# Patient Record
Sex: Male | Born: 1990 | Hispanic: No | Marital: Single | Smoking: Never smoker
Health system: Southern US, Community
[De-identification: ages and names within clinical notes are randomized; demographics above are authoritative.]

---

## 2018-09-05 ENCOUNTER — Other Ambulatory Visit: Payer: Self-pay

## 2018-09-05 ENCOUNTER — Emergency Department (HOSPITAL_COMMUNITY)
Admission: EM | Admit: 2018-09-05 | Discharge: 2018-09-05 | Disposition: A | Discharge status: Home / Self Care | Payer: Self-pay | Attending: Emergency Medicine | Admitting: Emergency Medicine

## 2018-09-05 ENCOUNTER — Emergency Department (HOSPITAL_COMMUNITY): Payer: Self-pay

## 2018-09-05 ENCOUNTER — Encounter (HOSPITAL_COMMUNITY): Payer: Self-pay | Admitting: Emergency Medicine

## 2018-09-05 DIAGNOSIS — R112 Nausea with vomiting, unspecified: Secondary | ICD-10-CM

## 2018-09-05 DIAGNOSIS — R519 Headache, unspecified: Secondary | ICD-10-CM

## 2018-09-05 DIAGNOSIS — R5383 Other fatigue: Secondary | ICD-10-CM

## 2018-09-05 DIAGNOSIS — R51 Headache: Secondary | ICD-10-CM | POA: Insufficient documentation

## 2018-09-05 LAB — URINALYSIS, ROUTINE W REFLEX MICROSCOPIC
BILIRUBIN URINE: NEGATIVE
GLUCOSE, UA: NEGATIVE mg/dL
HGB URINE DIPSTICK: NEGATIVE
Ketones, ur: NEGATIVE mg/dL
Leukocytes, UA: NEGATIVE
Nitrite: NEGATIVE
Protein, ur: NEGATIVE mg/dL
Specific Gravity, Urine: 1.021 (ref 1.005–1.030)
pH: 7 (ref 5.0–8.0)

## 2018-09-05 LAB — CBC WITH DIFFERENTIAL/PLATELET
Abs Immature Granulocytes: 0.02 10*3/uL (ref 0.00–0.07)
BASOS PCT: 1 %
Basophils Absolute: 0.1 10*3/uL (ref 0.0–0.1)
EOS PCT: 4 %
Eosinophils Absolute: 0.2 10*3/uL (ref 0.0–0.5)
HCT: 48.7 % (ref 39.0–52.0)
HEMOGLOBIN: 15.5 g/dL (ref 13.0–17.0)
Immature Granulocytes: 0 %
LYMPHS PCT: 28 %
Lymphs Abs: 1.6 10*3/uL (ref 0.7–4.0)
MCH: 29.7 pg (ref 26.0–34.0)
MCHC: 31.8 g/dL (ref 30.0–36.0)
MCV: 93.3 fL (ref 80.0–100.0)
MONO ABS: 0.6 10*3/uL (ref 0.1–1.0)
Monocytes Relative: 10 %
Neutro Abs: 3.3 10*3/uL (ref 1.7–7.7)
Neutrophils Relative %: 57 %
Platelets: 218 10*3/uL (ref 150–400)
RBC: 5.22 MIL/uL (ref 4.22–5.81)
RDW: 12.4 % (ref 11.5–15.5)
WBC: 5.8 10*3/uL (ref 4.0–10.5)
nRBC: 0 % (ref 0.0–0.2)

## 2018-09-05 LAB — COMPREHENSIVE METABOLIC PANEL
ALK PHOS: 62 U/L (ref 38–126)
ALT: 19 U/L (ref 0–44)
AST: 25 U/L (ref 15–41)
Albumin: 4 g/dL (ref 3.5–5.0)
Anion gap: 7 (ref 5–15)
BILIRUBIN TOTAL: 0.7 mg/dL (ref 0.3–1.2)
BUN: 15 mg/dL (ref 6–20)
CALCIUM: 9.2 mg/dL (ref 8.9–10.3)
CO2: 26 mmol/L (ref 22–32)
CREATININE: 1.44 mg/dL — AB (ref 0.61–1.24)
Chloride: 104 mmol/L (ref 98–111)
Glucose, Bld: 116 mg/dL — ABNORMAL HIGH (ref 70–99)
Potassium: 3.8 mmol/L (ref 3.5–5.1)
Sodium: 137 mmol/L (ref 135–145)
Total Protein: 7.7 g/dL (ref 6.5–8.1)

## 2018-09-05 LAB — LIPASE, BLOOD: LIPASE: 34 U/L (ref 11–51)

## 2018-09-05 MED ORDER — PROCHLORPERAZINE EDISYLATE 10 MG/2ML IJ SOLN
10.0000 mg | Freq: Once | INTRAMUSCULAR | Status: AC
  Administered 2018-09-05: 10 mg via INTRAVENOUS
  Filled 2018-09-05: qty 2

## 2018-09-05 MED ORDER — SODIUM CHLORIDE 0.9 % IV BOLUS
1000.0000 mL | Freq: Once | INTRAVENOUS | Status: AC
  Administered 2018-09-05: 1000 mL via INTRAVENOUS

## 2018-09-05 MED ORDER — KETOROLAC TROMETHAMINE 30 MG/ML IJ SOLN
30.0000 mg | Freq: Once | INTRAMUSCULAR | Status: AC
  Administered 2018-09-05: 30 mg via INTRAVENOUS
  Filled 2018-09-05: qty 1

## 2018-09-05 MED ORDER — DIPHENHYDRAMINE HCL 50 MG/ML IJ SOLN
25.0000 mg | Freq: Once | INTRAMUSCULAR | Status: AC
  Administered 2018-09-05: 25 mg via INTRAVENOUS
  Filled 2018-09-05: qty 1

## 2018-09-05 NOTE — ED Notes (Signed)
Patient transported to X-ray 

## 2018-09-05 NOTE — ED Triage Notes (Signed)
Pt. Stated, I think my body core temperature dropped yesterday and I was outside. I also had headache with N/V. Ive had a cough off and on. I did get some weakness

## 2018-09-05 NOTE — ED Provider Notes (Signed)
[facility] EMERGENCY DEPARTMENT Provider Note   CSN: [id number] Arrival date & time: 09/05/18  4098     History   Chief Complaint Chief Complaint  Patient presents with  . Chills  . Headache  . Cough  . Nausea    HPI Kurt Sparks is a 27 y.o. male.  Kurt Sparks is a 27 y.o. Male with a history of alcohol abuse, sober for 2 months, who presents to the emergency department for evaluation of headache, nausea and vomiting.  He reports that yesterday he was outside in the cold and rain for most of the day volunteering and thinks "his core body temperature dropped".  He reports since then he is been feeling kind of generally unwell with some chills.  He has had a headache since last night over the left side of his head that he describes as a dull ache he denies any associated vision changes.  But this morning his been feeling nauseated and had 2 episodes of nonbloody emesis.  He denies any numbness weakness or tingling in his arms or legs, no difficulty with speech or swallowing.  No facial asymmetry.  He denies any abdominal pain.  No chest pain or shortness of breath although does report that he has had a cough on and off for the past 2 weeks that is occasionally productive.  He reports he is felt generally weak and tired this morning after being up all day yesterday.  He denies any nasal congestion, rhinorrhea, sore throat or ear pain.  No body aches or joint pain.  No rashes.  Does not take any medications on a daily basis and has not taken anything for his symptoms prior to arrival.  Reports he used to be a heavy drinker, but has been clean from alcohol for 2 months.     History reviewed. No pertinent past medical history.  There are no active problems to display for this patient.   History reviewed. No pertinent surgical history.      Home Medications    Prior to Admission medications   Not on File    Family History No family history on  file.  Social History Social History   Tobacco Use  . Smoking status: Never Smoker  . Smokeless tobacco: Never Used  Substance Use Topics  . Alcohol use: Not on file  . Drug use: Not on file     Allergies   Patient has no allergy information on record.   Review of Systems Review of Systems  Constitutional: Positive for chills and fatigue. Negative for fever.  HENT: Negative for congestion, ear pain, rhinorrhea and sore throat.   Eyes: Negative for visual disturbance.  Respiratory: Positive for cough. Negative for chest tightness, shortness of breath and wheezing.   Cardiovascular: Negative for chest pain.  Gastrointestinal: Positive for nausea and vomiting. Negative for abdominal pain, constipation and diarrhea.  Genitourinary: Negative for dysuria and frequency.  Musculoskeletal: Negative for arthralgias, back pain, myalgias, neck pain and neck stiffness.  Skin: Negative for color change and rash.  Neurological: Positive for weakness and headaches. Negative for dizziness, syncope, facial asymmetry, speech difficulty, light-headedness and numbness.     Physical Exam Updated Vital Signs BP (!) 145/101 (BP Location: Right Arm)   Pulse (!) 104   Temp 98 F (36.7 C) (Oral)   Resp 18   Ht 5\' 7"  (1.702 m)   Wt 68 kg   SpO2 100%   BMI 23.49 kg/m   Physical Exam  Constitutional: He is oriented to person, place, and time. He appears well-developed and well-nourished.  Non-toxic appearance. He does not appear ill. No distress.  And appears a bit anxious but overall well and in no acute distress  HENT:  Head: Normocephalic and atraumatic.  Mouth/Throat: Oropharynx is clear and moist.  Eyes: Pupils are equal, round, and reactive to light. EOM are normal. Right eye exhibits no discharge. Left eye exhibits no discharge.  Neck: Normal range of motion. Neck supple. No neck rigidity. No Brudzinski's sign and no Kernig's sign noted.  Cardiovascular: Normal rate, regular rhythm,  normal heart sounds and intact distal pulses. Exam reveals no gallop and no friction rub.  No murmur heard. Pulmonary/Chest: Effort normal and breath sounds normal. No respiratory distress.  Respirations equal and unlabored, patient able to speak in full sentences, lungs clear to auscultation bilaterally  Abdominal: Soft. Bowel sounds are normal. He exhibits no distension and no mass. There is no tenderness. There is no guarding.  Respirations equal and unlabored, patient able to speak in full sentences, lungs clear to auscultation bilaterally  Musculoskeletal: He exhibits no edema.  Neurological: He is alert and oriented to person, place, and time. Coordination normal.  Speech is clear, able to follow commands CN III-XII intact Normal strength in upper and lower extremities bilaterally including dorsiflexion and plantar flexion, strong and equal grip strength Sensation normal to light and sharp touch Moves extremities without ataxia, coordination intact Normal finger to nose and rapid alternating movements No pronator drift  Skin: Skin is warm and dry. Capillary refill takes less than 2 seconds. He is not diaphoretic.  Psychiatric: He has a normal mood and affect. His behavior is normal.  Nursing note and vitals reviewed.    ED Treatments / Results  Labs (all labs ordered are listed, but only abnormal results are displayed) Labs Reviewed  COMPREHENSIVE METABOLIC PANEL - Abnormal; Notable for the following components:      Result Value   Glucose, Bld 116 (*)    Creatinine, Ser 1.44 (*)    All other components within normal limits  LIPASE, BLOOD  CBC WITH DIFFERENTIAL/PLATELET  URINALYSIS, ROUTINE W REFLEX MICROSCOPIC    EKG None  Radiology Dg Chest 2 View  Result Date: 09/05/2018 CLINICAL DATA:  Dry cough and sinus congestion for 2 weeks EXAM: CHEST - 2 VIEW COMPARISON:  None FINDINGS: Normal heart size, mediastinal contours, and pulmonary vascularity. Mild peribronchial  thickening. No pulmonary infiltrate, pleural effusion, or pneumothorax. Bones unremarkable. IMPRESSION: Minimal bronchitic changes without infiltrate. Electronically Signed   By: Ulyses SouthwardMark  Boles M.D.   On: 09/05/2018 08:59    Procedures Procedures (including critical care time)  Medications Ordered in ED Medications  sodium chloride 0.9 % bolus 1,000 mL (1,000 mLs Intravenous New Bag/Given 09/05/18 0845)  ketorolac (TORADOL) 30 MG/ML injection 30 mg (30 mg Intravenous Given 09/05/18 0842)  prochlorperazine (COMPAZINE) injection 10 mg (10 mg Intravenous Given 09/05/18 0842)  diphenhydrAMINE (BENADRYL) injection 25 mg (25 mg Intravenous Given 09/05/18 0843)     Initial Impression / Assessment and Plan / ED Course  I have reviewed the triage vital signs and the nursing notes.  Pertinent labs & imaging results that were available during my care of the patient were reviewed by me and considered in my medical decision making (see chart for details).  27 year old male presents with multiple symptoms including headache, chills, fatigue, nausea and vomiting.  No fevers.  No neck stiffness, normal neurologic exam.  Despite 2 episodes of vomiting  patient denies abdominal pain and has benign abdominal exam.  Has had some intermittent coughing but lungs are clear on exam.  Patient may have mild viral syndrome, he is slightly tachycardic on arrival, but I suspect this is due to anxiety with multiple people in the room, all other vitals stable.  We will get basic lab work and give headache cocktail, will also get chest x-ray given intermittent coughing and chills.  Reevaluate after medications.  Headache presentation not concerning for meningitis, ICH or SAH, no red flag symptoms.  Abs overall reassuring, no leukocytosis, normal hemoglobin, no electrolyte derangements, creatinine is slightly elevated at 1.44, no prior available for comparison, suspect mild dehydration, rehydrated with fluids and will have patient  get this rechecked within the week.  Normal liver function normal lipase, urinalysis without signs of infection.  Chest x-ray shows some mild bronchitic changes but no focal infiltrate or other active cardiopulmonary disease.  On reevaluation patient reports improvement in headache and that he is overall feeling better, suggests symptoms may be due to mild viral syndrome or patient being out in the cold and rain all day yesterday.  Encourage patient to continue to drink fluids and follow-up with his primary care doctor.  Return precautions discussed.  Patient expresses understanding and is in agreement with plan.  Stable for discharge home at this time.    Final Clinical Impressions(s) / ED Diagnoses   Final diagnoses:  Acute nonintractable headache, unspecified headache type  Non-intractable vomiting with nausea, unspecified vomiting type  Fatigue, unspecified type    ED Discharge Orders    None       Dartha Lodge, [place] 09/05/18 1015    Vanetta Mulders, MD 09/06/18 [phone]

## 2018-09-05 NOTE — Discharge Instructions (Signed)
Lab work is reassuring today aside from a slight increase in your kidney function everything else looks good, I would like free to have this rechecked with your regular doctor in 1 week to make sure that fluids have improved your creatinine.  Your symptoms may be due to a mild viral syndrome versus dehydration.  Make sure you are drinking plenty of fluids, Tylenol as needed for headaches or pain, avoid ibuprofen until your kidney function is rechecked.  Return to the emergency department for fevers, severe headache, persistent vomiting, neck pain or stiffness, abdominal pain or any other new or concerning symptoms.

## 2018-09-15 ENCOUNTER — Encounter (HOSPITAL_COMMUNITY): Payer: Self-pay | Admitting: *Deleted

## 2018-09-15 ENCOUNTER — Emergency Department (HOSPITAL_COMMUNITY)
Admission: EM | Admit: 2018-09-15 | Discharge: 2018-09-15 | Disposition: A | Discharge status: Home / Self Care | Payer: Self-pay | Attending: Emergency Medicine | Admitting: Emergency Medicine

## 2018-09-15 DIAGNOSIS — J209 Acute bronchitis, unspecified: Secondary | ICD-10-CM | POA: Insufficient documentation

## 2018-09-15 MED ORDER — DOXYCYCLINE HYCLATE 100 MG PO CAPS: 100.0000 mg | ORAL_CAPSULE | Freq: Two times a day (BID) | ORAL | 0 refills | Status: AC

## 2018-09-15 MED ORDER — ALBUTEROL SULFATE HFA 108 (90 BASE) MCG/ACT IN AERS
2.0000 | INHALATION_SPRAY | RESPIRATORY_TRACT | Status: DC
  Administered 2018-09-15: 2 via RESPIRATORY_TRACT
  Filled 2018-09-15: qty 6.7

## 2018-09-15 NOTE — ED Triage Notes (Addendum)
Pt in c/o continued cough, states he was seen for same last week and told he had a URI, no distress noted, pt also reports recent sore throat and fever, tachycardic at check in ranging 120-130, has been taking tylenol cold and flu at home as well as mucinex

## 2018-09-15 NOTE — ED Provider Notes (Signed)
[facility] EMERGENCY DEPARTMENT Provider Note   CSN: [id number] Arrival date & time: 09/15/18  0754     History   Chief Complaint Chief Complaint  Patient presents with  . Cough    HPI Kurt Sparks is a 27 y.o. male.  HPI 27 year old male who reports ongoing persistent cough since last week.  He has had upper respiratory symptoms.  He is had chills without documented fever at home.  He reports mild sore throat.  He has been taking Tylenol Cold and flu as well as Mucinex without improvement in his symptoms.  Cough is productive.  No exertional shortness of breath.  No orthopnea.  No unilateral leg swelling.  No history of DVT or pulmonary embolism.  Reports a history of "childhood asthma".   History reviewed. No pertinent past medical history.  There are no active problems to display for this patient.   History reviewed. No pertinent surgical history.      Home Medications    Prior to Admission medications   Medication Sig Start Date End Date Taking? Authorizing Provider  doxycycline (VIBRAMYCIN) 100 MG capsule Take 1 capsule (100 mg total) by mouth 2 (two) times daily. 09/15/18   Azalia Bilis, MD    Family History History reviewed. No pertinent family history.  Social History Social History   Tobacco Use  . Smoking status: Never Smoker  . Smokeless tobacco: Never Used  Substance Use Topics  . Alcohol use: Not on file  . Drug use: Not on file     Allergies   Patient has no known allergies.   Review of Systems Review of Systems  All other systems reviewed and are negative.    Physical Exam Updated Vital Signs Pulse (!) 125   Resp 20   SpO2 100%   Physical Exam  Constitutional: He is oriented to person, place, and time. He appears well-developed and well-nourished.  HENT:  Head: Normocephalic and atraumatic.  Eyes: EOM are normal.  Neck: Normal range of motion.  Cardiovascular: Normal rate, regular rhythm and normal heart  sounds.  Pulmonary/Chest: Effort normal and breath sounds normal. No respiratory distress. He has no wheezes. He has no rales.  Abdominal: Soft. He exhibits no distension. There is no tenderness.  Musculoskeletal: Normal range of motion.  Neurological: He is alert and oriented to person, place, and time.  Skin: Skin is warm and dry.  Psychiatric: He has a normal mood and affect. Judgment normal.  Nursing note and vitals reviewed.    ED Treatments / Results  Labs (all labs ordered are listed, but only abnormal results are displayed) Labs Reviewed - No data to display  EKG None  Radiology No results found.  Procedures Procedures (including critical care time)  Medications Ordered in ED Medications  albuterol (PROVENTIL HFA;VENTOLIN HFA) 108 (90 Base) MCG/ACT inhaler 2 puff (has no administration in time range)     Initial Impression / Assessment and Plan / ED Course  I have reviewed the triage vital signs and the nursing notes.  Pertinent labs & imaging results that were available during my care of the patient were reviewed by me and considered in my medical decision making (see chart for details).     Acute bronchitis.  Tachycardic in triage however on arrival to the room the patient's heart rate is in the high 90s.  He has no exertional shortness of breath.  History of what sounds like reactive airway disease.  Patient will be prescribed a bronchodilator as well  as antibiotics for acute bronchitis/developing early pneumonia.  Patient is stable for discharge.  Final Clinical Impressions(s) / ED Diagnoses   Final diagnoses:  Acute bronchitis, unspecified organism    ED Discharge Orders         Ordered    doxycycline (VIBRAMYCIN) 100 MG capsule  2 times daily     09/15/18 0816           Azalia Bilisampos, Gwendalyn Mcgonagle, MD 09/15/18 [phone]

## 2019-08-02 IMAGING — CR DG CHEST 2V
2 series · 2 of 2 positions shown · non-contrast
Comparison: None

CLINICAL DATA: Dry cough and sinus congestion for 2 weeks

EXAM:
CHEST - 2 VIEW

[chest lat]
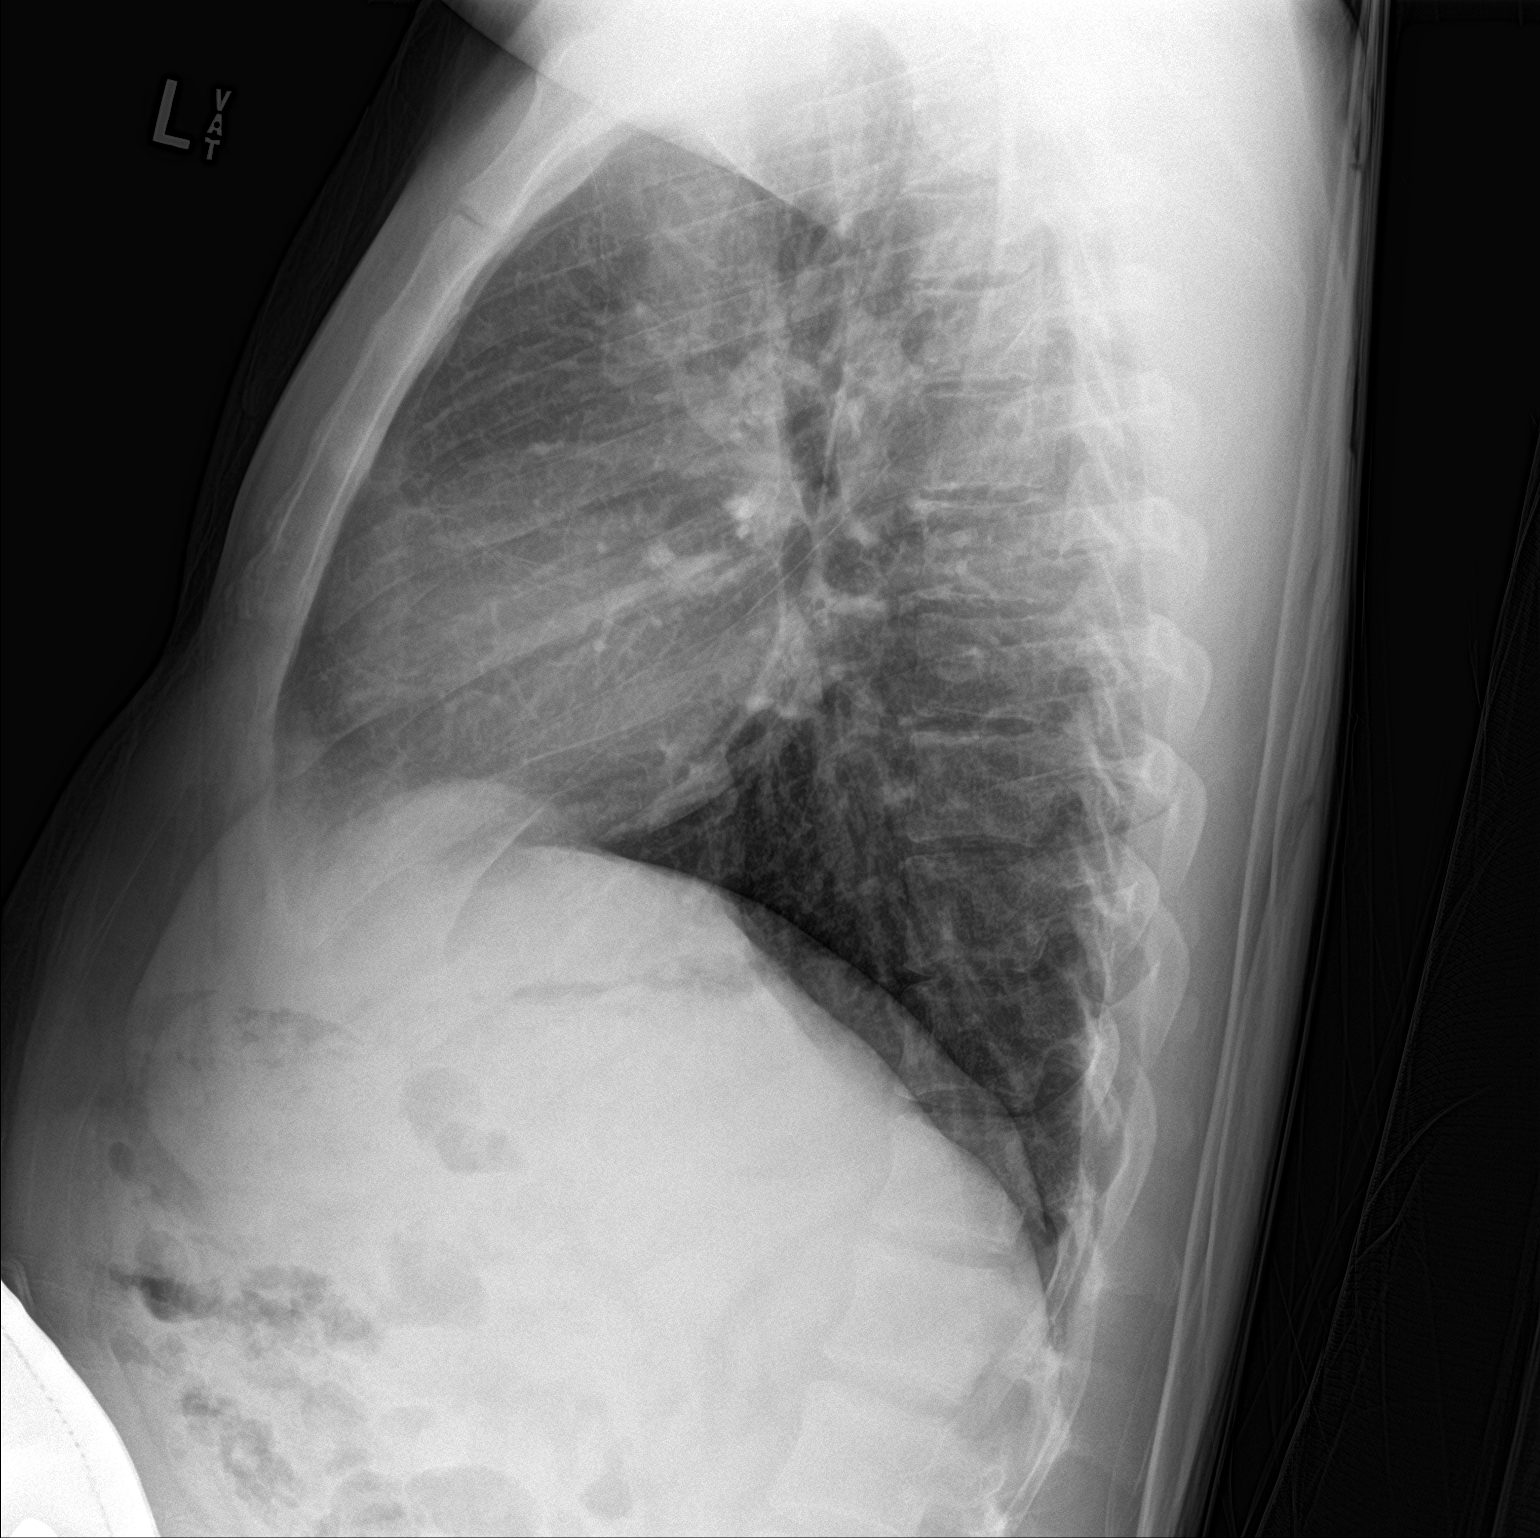

[chest ap]
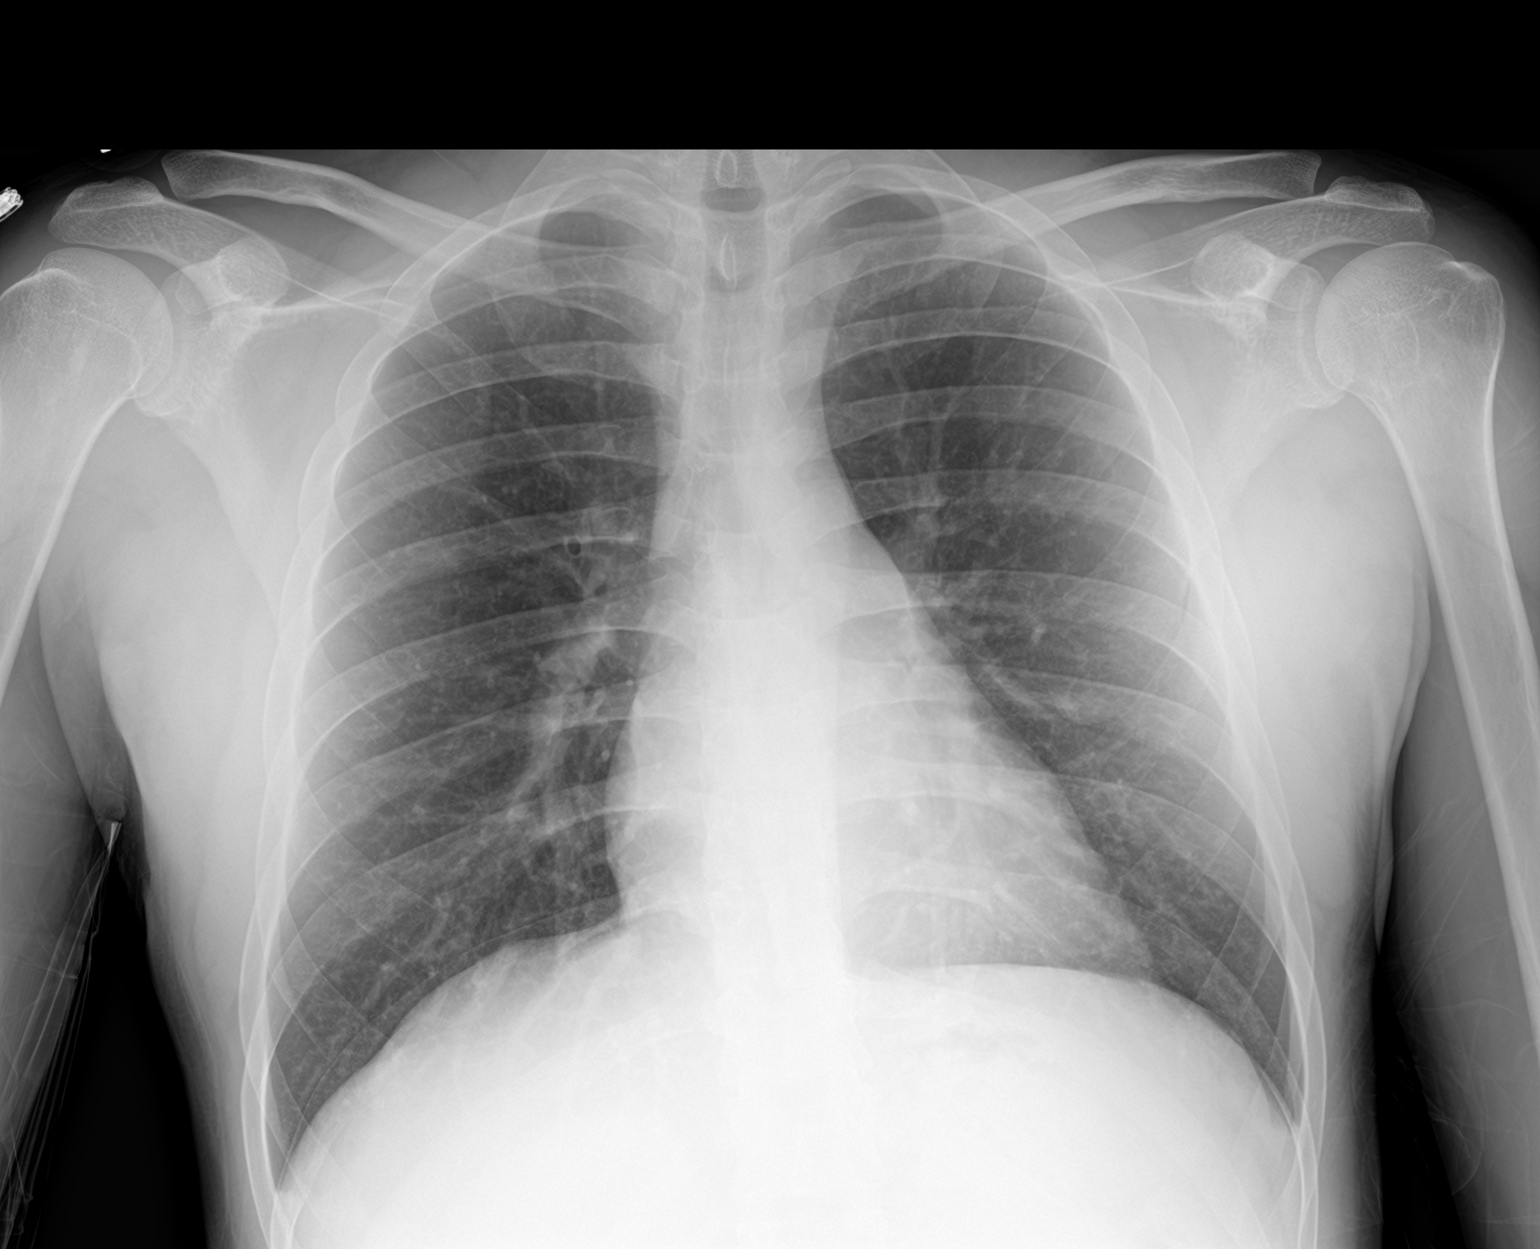

[2 of 2 positions shown; findings below may reference images not displayed]

FINDINGS: Normal heart size, mediastinal contours, and pulmonary vascularity.

Mild peribronchial thickening.

No pulmonary infiltrate, pleural effusion, or pneumothorax.

Bones unremarkable.
IMPRESSION: Minimal bronchitic changes without infiltrate.
